# Patient Record
Sex: Male | Born: 1955 | Race: White | Hispanic: No | Marital: Single | Smoking: Never smoker
Health system: Southern US, Community
[De-identification: ages and names within clinical notes are randomized; demographics above are authoritative.]

## PROBLEM LIST (undated history)

## (undated) DIAGNOSIS — E0590 Thyrotoxicosis, unspecified without thyrotoxic crisis or storm: Secondary | ICD-10-CM

## (undated) DIAGNOSIS — D509 Iron deficiency anemia, unspecified: Secondary | ICD-10-CM

## (undated) DIAGNOSIS — H9312 Tinnitus, left ear: Secondary | ICD-10-CM

## (undated) DIAGNOSIS — F419 Anxiety disorder, unspecified: Secondary | ICD-10-CM

## (undated) DIAGNOSIS — I517 Cardiomegaly: Secondary | ICD-10-CM

## (undated) DIAGNOSIS — K219 Gastro-esophageal reflux disease without esophagitis: Secondary | ICD-10-CM

## (undated) DIAGNOSIS — N529 Male erectile dysfunction, unspecified: Secondary | ICD-10-CM

## (undated) HISTORY — DX: Tinnitus, left ear: H93.12

## (undated) HISTORY — DX: Iron deficiency anemia, unspecified: D50.9

## (undated) HISTORY — DX: Gastro-esophageal reflux disease without esophagitis: K21.9

## (undated) HISTORY — DX: Male erectile dysfunction, unspecified: N52.9

## (undated) HISTORY — DX: Thyrotoxicosis, unspecified without thyrotoxic crisis or storm: E05.90

## (undated) HISTORY — DX: Anxiety disorder, unspecified: F41.9

## (undated) HISTORY — DX: Cardiomegaly: I51.7

---

## 2000-12-20 ENCOUNTER — Emergency Department (HOSPITAL_COMMUNITY): Admission: EM | Admit: 2000-12-20 | Discharge: 2000-12-21 | Payer: Self-pay | Admitting: *Deleted

## 2000-12-26 ENCOUNTER — Encounter: Payer: Self-pay | Admitting: Internal Medicine

## 2000-12-26 ENCOUNTER — Encounter: Admission: RE | Admit: 2000-12-26 | Discharge: 2000-12-26 | Payer: Self-pay | Admitting: Internal Medicine

## 2000-12-27 ENCOUNTER — Emergency Department (HOSPITAL_COMMUNITY): Admission: EM | Admit: 2000-12-27 | Discharge: 2000-12-27 | Payer: Self-pay | Admitting: Emergency Medicine

## 2007-09-17 ENCOUNTER — Encounter: Admission: RE | Admit: 2007-09-17 | Discharge: 2007-09-17 | Payer: Self-pay | Admitting: General Surgery

## 2007-09-27 ENCOUNTER — Emergency Department (HOSPITAL_COMMUNITY): Admission: EM | Admit: 2007-09-27 | Discharge: 2007-09-27 | Payer: Self-pay | Admitting: Emergency Medicine

## 2014-10-10 ENCOUNTER — Emergency Department (HOSPITAL_COMMUNITY)
Admission: EM | Admit: 2014-10-10 | Discharge: 2014-10-10 | Disposition: A | Discharge status: Home / Self Care | Payer: 59 | Source: Home / Self Care | Attending: Family Medicine | Admitting: Family Medicine

## 2014-10-10 ENCOUNTER — Encounter (HOSPITAL_COMMUNITY): Payer: Self-pay | Admitting: Family Medicine

## 2014-10-10 DIAGNOSIS — L03011 Cellulitis of right finger: Secondary | ICD-10-CM

## 2014-10-10 MED ORDER — IBUPROFEN 800 MG PO TABS
800.0000 mg | ORAL_TABLET | Freq: Once | ORAL | Status: AC
  Administered 2014-10-10: 800 mg via ORAL

## 2014-10-10 MED ORDER — IBUPROFEN 800 MG PO TABS
ORAL_TABLET | ORAL | Status: AC
  Filled 2014-10-10: qty 1

## 2014-10-10 NOTE — Discharge Instructions (Signed)
He developed a condition called paronychia. This was drained in our clinic. We sent a culture to find out which bacteria was present in the abscess. Please remember to wash the area daily with plenty of warm soapy water and tap water. Apply and biotic ointment and a clean bandage as needed. We will call you if you need further antibiotic treatment and please call us if your symptoms worsen.

## 2014-10-10 NOTE — ED Notes (Signed)
Right index finger is swollen and red

## 2014-10-10 NOTE — ED Notes (Signed)
Right index finger red and swollen

## 2014-10-10 NOTE — ED Provider Notes (Signed)
CSN: [id number]     Arrival date & time 10/10/14  1226 History   None    Chief Complaint  Patient presents with  . Finger Injury   (Consider location/radiation/quality/duration/timing/severity/associated sxs/prior Treatment) HPI  Patient reports picking at his finger week ago. Since that time patient has developed progressive constant painful swelling of the fingertip. No appreciable discharge. This involves the patient's right index finger and primarily at the base of the nail. Epsom salt soaks and hydroperoxide without improvement. Denies fevers, further joint involvement, rash, nausea, vomiting, chest pain, shortness of breath, palpitations.    History reviewed. No pertinent past medical history. History reviewed. No pertinent past surgical history. Family History  Problem Relation Age of Onset  . Hypertension Mother   . Cancer Brother     MM   History  Substance Use Topics  . Smoking status: Not on file  . Smokeless tobacco: Not on file  . Alcohol Use: Yes    Review of Systems Per HPI with all other pertinent systems negative.   Allergies  Review of patient's allergies indicates not on file.  Home Medications   Prior to Admission medications   Not on File   BP 160/95 mmHg  Pulse 78  Temp(Src) 98.2 F (36.8 C) (Oral)  Resp 116  SpO2 99% Physical Exam Physical Exam  Constitutional: oriented to person, place, and time. appears well-developed and well-nourished. No distress.  HENT:  Head: Normocephalic and atraumatic.  Eyes: EOMI. PERRL.   Neck: Normal range of motion.  Cardiovascular: RRR, no m/r/g, 2+ distal pulses,  Pulmonary/Chest: Effort normal and breath sounds normal. No respiratory distress.  Abdominal: Soft. Bowel sounds are normal. NonTTP, no distension.  Musculoskeletal: Normal range of motion. Non ttp, no effusion.  Neurological: alert and oriented to person, place, and time.  Skin: R index finger w/ marked swelling and erythema along the dorsum  near the nail.   Psychiatric: normal mood and affect. behavior is normal. Judgment and thought content normal.   ED Course  INCISION AND DRAINAGE Date/Time: 10/10/2014 2:12 PM Performed by: Konrad DoloresMERRELL, DAVID J Authorized by: Konrad DoloresMERRELL, DAVID J Consent: Verbal consent obtained. Risks and benefits: risks, benefits and alternatives were discussed Consent given by: patient Patient identity confirmed: verbally with patient Type: abscess Location: R index paronychia. Anesthesia: digital block Local anesthetic: lidocaine 2% without epinephrine Anesthetic total: 4 ml Scalpel size: 11 Incision type: single straight Complexity: simple Drainage: purulent Drainage amount: copious Wound treatment: wound left open (Copious irrigation with normal saline) Patient tolerance: Patient tolerated the procedure well with no immediate complications   (including critical care time) Labs Review Labs Reviewed - No data to display  Imaging Review No results found.   MDM   1. Paronychia, right    right index finger paronychia. Drain as above. No need for antibiotics as treatment of choice is drainage and copious cleanout. Embolic ointment and sterile bandage applied. Fall precautions discussed.  Of note patient hypertensive but this is a first time for this patient since patient states that he has white coat syndrome. Follow-up with PCP if remains hypertensive. Ibuprofen 800 mg given by mouth in clinic.    Ozella Rocksavid J Merrell, MD 10/10/14 [phone]

## 2014-10-14 ENCOUNTER — Telehealth (HOSPITAL_COMMUNITY): Payer: Self-pay | Admitting: Family Medicine

## 2014-10-14 LAB — CULTURE, ROUTINE-ABSCESS

## 2014-10-14 NOTE — ED Notes (Signed)
Called patient to review culture report and see how he is doing. Patient reports that he is significantly better with only a small [place] of erythema left and very little tenderness. Patient feels nearly back to baseline.  Shelly Flattenavid Merrell, MD Family Medicine 10/14/2014, 4:36 PM    Ozella Rocksavid J Merrell, MD 10/14/14 [phone]

## 2015-08-02 DIAGNOSIS — Z23 Encounter for immunization: Secondary | ICD-10-CM | POA: Diagnosis not present

## 2015-08-02 DIAGNOSIS — Z0000 Encounter for general adult medical examination without abnormal findings: Secondary | ICD-10-CM | POA: Diagnosis not present

## 2016-01-10 DIAGNOSIS — H524 Presbyopia: Secondary | ICD-10-CM | POA: Diagnosis not present

## 2016-01-10 DIAGNOSIS — H52223 Regular astigmatism, bilateral: Secondary | ICD-10-CM | POA: Diagnosis not present

## 2016-01-10 DIAGNOSIS — H5203 Hypermetropia, bilateral: Secondary | ICD-10-CM | POA: Diagnosis not present

## 2016-03-23 DIAGNOSIS — N529 Male erectile dysfunction, unspecified: Secondary | ICD-10-CM | POA: Diagnosis not present

## 2016-08-21 DIAGNOSIS — Z0000 Encounter for general adult medical examination without abnormal findings: Secondary | ICD-10-CM | POA: Diagnosis not present

## 2016-08-21 DIAGNOSIS — Z125 Encounter for screening for malignant neoplasm of prostate: Secondary | ICD-10-CM | POA: Diagnosis not present

## 2016-08-21 DIAGNOSIS — R030 Elevated blood-pressure reading, without diagnosis of hypertension: Secondary | ICD-10-CM | POA: Diagnosis not present

## 2017-02-05 DIAGNOSIS — H52223 Regular astigmatism, bilateral: Secondary | ICD-10-CM | POA: Diagnosis not present

## 2017-02-05 DIAGNOSIS — H524 Presbyopia: Secondary | ICD-10-CM | POA: Diagnosis not present

## 2017-02-05 DIAGNOSIS — H5203 Hypermetropia, bilateral: Secondary | ICD-10-CM | POA: Diagnosis not present

## 2017-06-07 ENCOUNTER — Encounter (HOSPITAL_COMMUNITY): Payer: Self-pay | Admitting: Emergency Medicine

## 2017-06-07 ENCOUNTER — Other Ambulatory Visit: Payer: Self-pay

## 2017-06-07 ENCOUNTER — Ambulatory Visit (HOSPITAL_COMMUNITY)
Admission: EM | Admit: 2017-06-07 | Discharge: 2017-06-07 | Disposition: A | Discharge status: Home / Self Care | Payer: 59 | Attending: Physician Assistant | Admitting: Physician Assistant

## 2017-06-07 DIAGNOSIS — L03011 Cellulitis of right finger: Secondary | ICD-10-CM | POA: Diagnosis not present

## 2017-06-07 MED ORDER — SULFAMETHOXAZOLE-TRIMETHOPRIM 800-160 MG PO TABS: 1.0000 | ORAL_TABLET | Freq: Two times a day (BID) | ORAL | 0 refills | Status: AC

## 2017-06-07 NOTE — ED Provider Notes (Signed)
[facility]    CSN: [id number] Arrival date & time: 06/07/17  1901     History   Chief Complaint Chief Complaint  Patient presents with  . Hand Pain    HPI Maurice Klein is a 62 y.o. male.   The history is provided by the patient. No language interpreter was used.  Hand Pain  This is a new problem. The problem occurs constantly. The problem has been gradually worsening. Nothing aggravates the symptoms. Nothing relieves the symptoms. He has tried nothing for the symptoms. The treatment provided no relief.   Pt reports he gets frequent fingernail infections.   History reviewed. No pertinent past medical history.  There are no active problems to display for this patient.   History reviewed. No pertinent surgical history.     Home Medications    Prior to Admission medications   Medication Sig Start Date End Date Taking? Authorizing Provider  sulfamethoxazole-trimethoprim (BACTRIM DS,SEPTRA DS) 800-160 MG tablet Take 1 tablet by mouth 2 (two) times daily for 7 days. 06/07/17 06/14/17  Elson AreasSofia, Leslie K, PA-C    Family History Family History  Problem Relation Age of Onset  . Hypertension Mother   . Cancer Brother        MM    Social History Social History   Tobacco Use  . Smoking status: Never Smoker  . Smokeless tobacco: Never Used  Substance Use Topics  . Alcohol use: Yes  . Drug use: No     Allergies   Patient has no known allergies.   Review of Systems Review of Systems  All other systems reviewed and are negative.    Physical Exam Triage Vital Signs ED Triage Vitals  Enc Vitals Group     BP 06/07/17 1953 (!) 169/93     Pulse Rate 06/07/17 1953 80     Resp 06/07/17 1953 18     Temp 06/07/17 1953 98.2 F (36.8 C)     Temp Source 06/07/17 1953 Oral     SpO2 06/07/17 1953 99 %     Weight --      Height --      Head Circumference --      Peak Flow --      Pain Score 06/07/17 1952 3     Pain Loc --      Pain Edu? --      Excl.  in GC? --    No data found.  Updated Vital Signs BP (!) 169/93 (BP Location: Left Arm)   Pulse 80   Temp 98.2 F (36.8 C) (Oral)   Resp 18   SpO2 99%   Visual Acuity Right Eye Distance:   Left Eye Distance:   Bilateral Distance:    Right Eye Near:   Left Eye Near:    Bilateral Near:     Physical Exam  Constitutional: He appears well-developed and well-nourished.  Musculoskeletal: He exhibits tenderness.  Swelling distal right 3rd finger nv and ns intact  Neurological: He is alert.  Skin: Skin is warm.  Psychiatric: He has a normal mood and affect.  Vitals reviewed.    UC Treatments / Results  Labs (all labs ordered are listed, but only abnormal results are displayed) Labs Reviewed - No data to display  EKG  EKG Interpretation None       Radiology No results found.  Procedures Procedures (including critical care time)  Medications Ordered in UC Medications - No data to display   Initial Impression /  Assessment and Plan / UC Course  I have reviewed the triage vital signs and the nursing notes.  Pertinent labs & imaging results that were available during my care of the patient were reviewed by me and considered in my medical decision making (see chart for details).     Procedure.  Prep with shurcleans,  Freeze spray,  Incised with 18 gauge needle.  No drainage.    Final Clinical Impressions(s) / UC Diagnoses   Final diagnoses:  Paronychia of right middle finger    ED Discharge Orders        Ordered    sulfamethoxazole-trimethoprim (BACTRIM DS,SEPTRA DS) 800-160 MG tablet  2 times daily     06/07/17 2020       Controlled Substance Prescriptions [place] Controlled Substance Registry consulted? Not Applicable   Pt advised soak area 20 minutes 4 times a day  An After Visit Summary was printed and given to the patient. Meds ordered this encounter  Medications  . sulfamethoxazole-trimethoprim (BACTRIM DS,SEPTRA DS) 800-160 MG tablet    Sig: Take  1 tablet by mouth 2 (two) times daily for 7 days.    Dispense:  14 tablet    Refill:  0    Order Specific Question:   Supervising Provider    Answer:   Isa Rankin [[id number]]     Elson Areas, [place] 06/07/17 2032

## 2017-06-07 NOTE — ED Triage Notes (Signed)
[facility] a complaint of a possible paronychia to the middle finger of the right hand x 3 days. The patient presented with pain and redness and swelling.

## 2017-06-07 NOTE — Discharge Instructions (Signed)
Soak area 20 minutes 4 times a day °

## 2017-06-19 DIAGNOSIS — E871 Hypo-osmolality and hyponatremia: Secondary | ICD-10-CM | POA: Diagnosis not present

## 2017-06-19 DIAGNOSIS — R946 Abnormal results of thyroid function studies: Secondary | ICD-10-CM | POA: Insufficient documentation

## 2017-06-19 DIAGNOSIS — R419 Unspecified symptoms and signs involving cognitive functions and awareness: Secondary | ICD-10-CM | POA: Diagnosis present

## 2017-06-19 DIAGNOSIS — F419 Anxiety disorder, unspecified: Secondary | ICD-10-CM | POA: Diagnosis not present

## 2017-06-20 ENCOUNTER — Encounter (HOSPITAL_COMMUNITY): Payer: Self-pay

## 2017-06-20 ENCOUNTER — Emergency Department (HOSPITAL_COMMUNITY): Payer: 59

## 2017-06-20 ENCOUNTER — Other Ambulatory Visit: Payer: Self-pay

## 2017-06-20 ENCOUNTER — Emergency Department (HOSPITAL_COMMUNITY)
Admission: EM | Admit: 2017-06-20 | Discharge: 2017-06-20 | Disposition: A | Discharge status: Home / Self Care | Payer: 59 | Attending: Emergency Medicine | Admitting: Emergency Medicine

## 2017-06-20 DIAGNOSIS — R7989 Other specified abnormal findings of blood chemistry: Secondary | ICD-10-CM

## 2017-06-20 DIAGNOSIS — E871 Hypo-osmolality and hyponatremia: Secondary | ICD-10-CM | POA: Diagnosis not present

## 2017-06-20 DIAGNOSIS — R946 Abnormal results of thyroid function studies: Secondary | ICD-10-CM | POA: Diagnosis not present

## 2017-06-20 DIAGNOSIS — G9389 Other specified disorders of brain: Secondary | ICD-10-CM | POA: Diagnosis not present

## 2017-06-20 DIAGNOSIS — F410 Panic disorder [episodic paroxysmal anxiety] without agoraphobia: Secondary | ICD-10-CM

## 2017-06-20 DIAGNOSIS — F419 Anxiety disorder, unspecified: Secondary | ICD-10-CM | POA: Diagnosis not present

## 2017-06-20 LAB — BASIC METABOLIC PANEL
Anion gap: 10 (ref 5–15)
BUN: 16 mg/dL (ref 6–20)
CHLORIDE: 98 mmol/L — AB (ref 101–111)
CO2: 23 mmol/L (ref 22–32)
CREATININE: 0.57 mg/dL — AB (ref 0.61–1.24)
Calcium: 9 mg/dL (ref 8.9–10.3)
GFR calc non Af Amer: >60 mL/min (ref >60)
Glucose, Bld: 103 mg/dL — ABNORMAL HIGH (ref 65–99)
POTASSIUM: 3.6 mmol/L (ref 3.5–5.1)
SODIUM: 131 mmol/L — AB (ref 135–145)

## 2017-06-20 LAB — I-STAT TROPONIN, ED
TROPONIN I, POC: 0.02 ng/mL (ref 0.00–0.08)
TROPONIN I, POC: 0.01 ng/mL (ref 0.00–0.08)

## 2017-06-20 LAB — URINALYSIS, ROUTINE W REFLEX MICROSCOPIC
Bilirubin Urine: NEGATIVE
Glucose, UA: NEGATIVE mg/dL
Hgb urine dipstick: NEGATIVE
KETONES UR: 5 mg/dL — AB
LEUKOCYTES UA: NEGATIVE
NITRITE: NEGATIVE
PROTEIN: NEGATIVE mg/dL
Specific Gravity, Urine: 1.014 (ref 1.005–1.030)
pH: 7 (ref 5.0–8.0)

## 2017-06-20 LAB — TSH: TSH: 6.207 u[IU]/mL — AB (ref 0.350–4.500)

## 2017-06-20 LAB — CBC WITH DIFFERENTIAL/PLATELET
BASOS PCT: 0 %
Basophils Absolute: 0 10*3/uL (ref 0.0–0.1)
Eosinophils Absolute: 0.1 10*3/uL (ref 0.0–0.7)
Eosinophils Relative: 1 %
HEMATOCRIT: 35.8 % — AB (ref 39.0–52.0)
HEMOGLOBIN: 11.8 g/dL — AB (ref 13.0–17.0)
LYMPHS ABS: 1.5 10*3/uL (ref 0.7–4.0)
Lymphocytes Relative: 18 %
MCH: 24.5 pg — ABNORMAL LOW (ref 26.0–34.0)
MCHC: 33 g/dL (ref 30.0–36.0)
MCV: 74.3 fL — ABNORMAL LOW (ref 78.0–100.0)
MONOS PCT: 7 %
Monocytes Absolute: 0.6 10*3/uL (ref 0.1–1.0)
NEUTROS ABS: 6.1 10*3/uL (ref 1.7–7.7)
Neutrophils Relative %: 74 %
Platelets: 282 10*3/uL (ref 150–400)
RBC: 4.82 MIL/uL (ref 4.22–5.81)
RDW: 17.3 % — ABNORMAL HIGH (ref 11.5–15.5)
WBC: 8.3 10*3/uL (ref 4.0–10.5)

## 2017-06-20 LAB — CBG MONITORING, ED: Glucose-Capillary: 113 mg/dL — ABNORMAL HIGH (ref 65–99)

## 2017-06-20 LAB — TROPONIN I

## 2017-06-20 MED ORDER — SODIUM CHLORIDE 0.9 % IV BOLUS (SEPSIS)
1000.0000 mL | Freq: Once | INTRAVENOUS | Status: AC
  Administered 2017-06-20: 1000 mL via INTRAVENOUS

## 2017-06-20 NOTE — ED Triage Notes (Signed)
Pt reports episodes of anxiety over the past week where he feels nauseous, weak in his legs, and anxious for short periods of time. He reports that today the anxiety has been more constant. Denies SI/HI. A&Ox4.

## 2017-06-20 NOTE — ED Notes (Signed)
Patient transported to X-ray 

## 2017-06-20 NOTE — ED Provider Notes (Signed)
[facility]-EMERGENCY DEPT Provider Note   CSN: [id number] Arrival date & time: 06/19/17  2240     History   Chief Complaint Chief Complaint  Patient presents with  . Anxiety    HPI Maurice Klein is a 62 y.o. male.  The history is provided by the patient.  Anxiety  This is a recurrent problem. The current episode started more than 1 week ago. Episode frequency: episodically  The problem has not changed since onset.Pertinent negatives include no chest pain, no abdominal pain, no headaches and no shortness of breath. Associated symptoms comments: Weak all over and shaky . Nothing aggravates the symptoms. Nothing relieves the symptoms. He has tried nothing for the symptoms. The treatment provided no relief.  Is feeling anxious, episodes lasting secondas to minutes with shakiness and global weakness. No chest pain or shortness of breath.  States he has been seen by Dr. Valentina Lucks in the past for same and has been on Ativan in the past for same.  Marland Kitchen     History reviewed. No pertinent past medical history.  There are no active problems to display for this patient.   History reviewed. No pertinent surgical history.     Home Medications    Prior to Admission medications   Not on File    Family History Family History  Problem Relation Age of Onset  . Hypertension Mother   . Cancer Brother        MM    Social History Social History   Tobacco Use  . Smoking status: Never Smoker  . Smokeless tobacco: Never Used  Substance Use Topics  . Alcohol use: Yes  . Drug use: No     Allergies   Patient has no known allergies.   Review of Systems Review of Systems  Constitutional:       Feels globally weak and shaky  Eyes: Negative for photophobia.  Respiratory: Negative for shortness of breath.   Cardiovascular: Negative for chest pain.  Gastrointestinal: Negative for abdominal pain.  Neurological: Negative for syncope, facial asymmetry, speech  difficulty, weakness, light-headedness, numbness and headaches.  Psychiatric/Behavioral: The patient is nervous/anxious.   All other systems reviewed and are negative.    Physical Exam Updated Vital Signs BP (!) 172/83 (BP Location: Left Arm)   Pulse 95   Temp 98.4 F (36.9 C)   Resp 16   SpO2 95%   Physical Exam  Constitutional: He is oriented to person, place, and time. He appears well-developed and well-nourished. No distress.  HENT:  Head: Normocephalic and atraumatic.  Mouth/Throat: No oropharyngeal exudate.  Eyes: Conjunctivae are normal. Pupils are equal, round, and reactive to light.  Neck: Normal range of motion. Neck supple.  Cardiovascular: Normal rate, regular rhythm, normal heart sounds and intact distal pulses.  Pulmonary/Chest: Effort normal and breath sounds normal. No stridor. He has no wheezes. He has no rales.  Abdominal: Soft. Bowel sounds are normal. He exhibits no mass. There is no tenderness. There is no rebound and no guarding.  Musculoskeletal: Normal range of motion.  Neurological: He is alert and oriented to person, place, and time. He displays normal reflexes.  Skin: Skin is warm and dry. Capillary refill takes less than 2 seconds.  Psychiatric: He has a normal mood and affect. His behavior is normal.     ED Treatments / Results  Labs (all labs ordered are listed, but only abnormal results are displayed)  Results for orders placed or performed during the hospital encounter  of 06/20/17  CBC with Differential/Platelet  Result Value Ref Range   WBC 8.3 4.0 - 10.5 K/uL   RBC 4.82 4.22 - 5.81 MIL/uL   Hemoglobin 11.8 (L) 13.0 - 17.0 g/dL   HCT 16.135.8 (L) 09.639.0 - 04.552.0 %   MCV 74.3 (L) 78.0 - 100.0 fL   MCH 24.5 (L) 26.0 - 34.0 pg   MCHC 33.0 30.0 - 36.0 g/dL   RDW 40.917.3 (H) 81.111.5 - 91.415.5 %   Platelets 282 150 - 400 K/uL   Neutrophils Relative % 74 %   Neutro Abs 6.1 1.7 - 7.7 K/uL   Lymphocytes Relative 18 %   Lymphs Abs 1.5 0.7 - 4.0 K/uL    Monocytes Relative 7 %   Monocytes Absolute 0.6 0.1 - 1.0 K/uL   Eosinophils Relative 1 %   Eosinophils Absolute 0.1 0.0 - 0.7 K/uL   Basophils Relative 0 %   Basophils Absolute 0.0 0.0 - 0.1 K/uL  Basic metabolic panel  Result Value Ref Range   Sodium 131 (L) 135 - 145 mmol/L   Potassium 3.6 3.5 - 5.1 mmol/L   Chloride 98 (L) 101 - 111 mmol/L   CO2 23 22 - 32 mmol/L   Glucose, Bld 103 (H) 65 - 99 mg/dL   BUN 16 6 - 20 mg/dL   Creatinine, Ser 7.820.57 (L) 0.61 - 1.24 mg/dL   Calcium 9.0 8.9 - 95.610.3 mg/dL   GFR calc non Af Amer >60 >60 mL/min   GFR calc Af Amer >60 >60 mL/min   Anion gap 10 5 - 15  POC CBG, ED  Result Value Ref Range   Glucose-Capillary 113 (H) 65 - 99 mg/dL  I-stat troponin, ED  Result Value Ref Range   Troponin i, poc 0.02 0.00 - 0.08 ng/mL   Comment 3           Dg Chest 2 View  Result Date: 06/20/2017 CLINICAL DATA:  Anxiety.  Weak and shaky. EXAM: CHEST  2 VIEW COMPARISON:  09/17/2007 FINDINGS: Normal heart size and pulmonary vascularity. No focal airspace disease or consolidation in the lungs. No blunting of costophrenic angles. No pneumothorax. Mediastinal contours appear intact. IMPRESSION: No active cardiopulmonary disease. Electronically Signed   By: Burman NievesWilliam  Stevens M.D.   On: 06/20/2017 03:19   Ct Head Wo Contrast  Result Date: 06/20/2017 CLINICAL DATA:  Anxiety for 1 week EXAM: CT HEAD WITHOUT CONTRAST TECHNIQUE: Contiguous axial images were obtained from the base of the skull through the vertex without intravenous contrast. COMPARISON:  None. FINDINGS: Brain: No evidence of acute infarction, hemorrhage, hydrocephalus, extra-axial collection or mass lesion/mass effect. Vascular: No hyperdense vessels. Minimal calcification at the carotid siphons Skull: Normal. Negative for fracture or focal lesion. Sinuses/Orbits: Mild mucosal thickening in the ethmoid sinuses. No acute orbital abnormality. Other: None IMPRESSION: Negative.  No CT evidence for acute  intracranial abnormality. Electronically Signed   By: Jasmine PangKim  Fujinaga M.D.   On: 06/20/2017 03:22    EKG  EKG Interpretation  Date/Time:  Thursday June 20 2017 03:27:37 EST Ventricular Rate:  62 PR Interval:    QRS Duration: 115 QT Interval:  433 QTC Calculation: 440 R Axis:   17 Text Interpretation:  Sinus rhythm Incomplete right bundle branch block Left ventricular hypertrophy Confirmed by Toluwanimi Radebaugh ([id number]) on 06/20/2017 3:41:28 AM       Radiology Dg Chest 2 View  Result Date: 06/20/2017 CLINICAL DATA:  Anxiety.  Weak and shaky. EXAM: CHEST  2 VIEW COMPARISON:  09/17/2007  FINDINGS: Normal heart size and pulmonary vascularity. No focal airspace disease or consolidation in the lungs. No blunting of costophrenic angles. No pneumothorax. Mediastinal contours appear intact. IMPRESSION: No active cardiopulmonary disease. Electronically Signed   By: Burman Nieves M.D.   On: 06/20/2017 03:19    Procedures Procedures (including critical care time)  EDP had lengthy discussion with patient regarding his lab and imaging results.  EDP stated patient had LVH on EKG.  Patient states he has had this in the past.  Patient requested a TSH and this was ordered.  EDP explained it may not come back during this visit but it was ordered and Dr. Valentina Lucks can follow up on the results. Given the hyponatremia (he states a while ago it was 134) we would give a liter of NSS and EDP stated we would recheck a troponin to ensure that this was not cardiac in nature.   Patient states he would like ativan, but drove himself and has a long drive home.  EDP states she would be happy to give the patient a dose but patient would need a ride home as we cannot put patient in a cab or Uber.    [place] database checked no RX for ativan in the system.    Patient has refused TTS consult  I am uncomfortable starting ativan in this patient. As I am unsure what is bringing on these feelings, I will refer patient to his PMD  for further treatment.  Moreover, will refer to endocrinology for further testing of his thyroid.    Final Clinical Impressions(s) / ED Diagnoses    Return for weakness, numbness, changes in vision or speech,  fevers > 100.4 unrelieved by medication, shortness of breath, intractable vomiting, or diarrhea, abdominal pain, Inability to tolerate liquids or food, cough, altered mental status or any concerns. No signs of systemic illness or infection. The patient is nontoxic-appearing on exam and vital signs are within normal limits.    I have reviewed the triage vital signs and the nursing notes. Pertinent labs &imaging results that were available during my care of the patient were reviewed by me and considered in my medical decision making (see chart for details).  After history, exam, and medical workup I feel the patient has been appropriately medically screened and is safe for discharge home. Pertinent diagnoses were discussed with the patient. Patient was given return precautions.      Azari Janssens, MD 06/20/17 [phone]

## 2017-06-21 DIAGNOSIS — Z1211 Encounter for screening for malignant neoplasm of colon: Secondary | ICD-10-CM | POA: Diagnosis not present

## 2017-06-21 DIAGNOSIS — E871 Hypo-osmolality and hyponatremia: Secondary | ICD-10-CM | POA: Diagnosis not present

## 2017-06-21 DIAGNOSIS — R946 Abnormal results of thyroid function studies: Secondary | ICD-10-CM | POA: Diagnosis not present

## 2017-06-21 DIAGNOSIS — F419 Anxiety disorder, unspecified: Secondary | ICD-10-CM | POA: Diagnosis not present

## 2017-06-21 DIAGNOSIS — D649 Anemia, unspecified: Secondary | ICD-10-CM | POA: Diagnosis not present

## 2017-06-21 DIAGNOSIS — D509 Iron deficiency anemia, unspecified: Secondary | ICD-10-CM | POA: Diagnosis not present

## 2017-07-23 DIAGNOSIS — Z1211 Encounter for screening for malignant neoplasm of colon: Secondary | ICD-10-CM | POA: Diagnosis not present

## 2017-07-23 DIAGNOSIS — D509 Iron deficiency anemia, unspecified: Secondary | ICD-10-CM | POA: Diagnosis not present

## 2017-07-29 ENCOUNTER — Encounter: Payer: Self-pay | Admitting: Psychiatry

## 2017-07-29 ENCOUNTER — Ambulatory Visit: Payer: Self-pay | Admitting: Psychiatry

## 2017-07-29 VITALS — Temp 98.2°F | Resp 20 | Wt 185.4 lb

## 2017-07-29 DIAGNOSIS — J3489 Other specified disorders of nose and nasal sinuses: Secondary | ICD-10-CM

## 2017-07-29 DIAGNOSIS — R0989 Other specified symptoms and signs involving the circulatory and respiratory systems: Secondary | ICD-10-CM

## 2017-07-29 LAB — POCT INFLUENZA A/B
INFLUENZA A, POC: NEGATIVE
Influenza B, POC: NEGATIVE

## 2017-07-29 NOTE — Progress Notes (Signed)
Subjective:     Maurice Klein is a 62 y.o. male who presents for evaluation and treatment of allergic symptoms. Symptoms include: clear rhinorrhea and are not present in a seasonal pattern. Precipitants include: unknown. Treatment currently includes none . 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 He reports he was having some mild nasal drainage and was sniffing, his coworkers thought he might have gotten the flu and asked for him to go get checked. Patient denies any associated symptoms of fever, sore throat, congestion, body ache, shortness of breath or chest pain.  The following portions of the patient's history were reviewed and updated as appropriate: allergies, current medications, past family history, past medical history, past social history, past surgical history and problem list.  Review of Systems Pertinent items are noted in HPI.    Objective:    General appearance: alert, cooperative and no distress Head: Normocephalic, without obvious abnormality, atraumatic Ears: normal TM's and external ear canals both ears Nose: Nares normal. Septum midline. Mucosa normal. No drainage or sinus tenderness., turbinates pale Throat: lips, mucosa, and tongue normal; teeth and gums normal Neck: no adenopathy, no carotid bruit, no JVD, supple, symmetrical, trachea midline and thyroid not enlarged, symmetric, no tenderness/mass/nodules Lungs: clear to auscultation bilaterally Heart: regular rate and rhythm, S1, S2 normal, no murmur, click, rub or gallop    Results for orders placed or performed in visit on 07/29/17 (from the past 24 hour(s))  POCT Influenza A/B     Status: Normal   Collection Time: 07/29/17  2:45 PM  Result Value Ref Range   Influenza A, POC Negative Negative   Influenza B, POC Negative Negative    Assessment:    Allergic rhinitis.   Howie was seen today for congestion.  Diagnoses and all orders for this visit:  Nasal drainage -     POCT  Influenza A/B    Plan:   Follow up as needed. Return to the clinic if symptom persist beyond 7 days or go to the ED with any worsening symptoms. Continue routine exercise and ensure adequate fluid intake.

## 2017-08-02 ENCOUNTER — Telehealth: Payer: Self-pay

## 2017-08-02 NOTE — Telephone Encounter (Signed)
Called and spoke with pt to see how he was feeling since his visit with us and pt states he is doing much better.

## 2017-08-06 DIAGNOSIS — D509 Iron deficiency anemia, unspecified: Secondary | ICD-10-CM | POA: Diagnosis not present

## 2017-08-22 DIAGNOSIS — R030 Elevated blood-pressure reading, without diagnosis of hypertension: Secondary | ICD-10-CM | POA: Diagnosis not present

## 2017-08-22 DIAGNOSIS — Z0000 Encounter for general adult medical examination without abnormal findings: Secondary | ICD-10-CM | POA: Diagnosis not present

## 2017-08-22 DIAGNOSIS — K219 Gastro-esophageal reflux disease without esophagitis: Secondary | ICD-10-CM | POA: Diagnosis not present

## 2017-08-22 DIAGNOSIS — Z23 Encounter for immunization: Secondary | ICD-10-CM | POA: Diagnosis not present

## 2017-09-18 DIAGNOSIS — Z1211 Encounter for screening for malignant neoplasm of colon: Secondary | ICD-10-CM | POA: Diagnosis not present

## 2017-10-22 DIAGNOSIS — R030 Elevated blood-pressure reading, without diagnosis of hypertension: Secondary | ICD-10-CM | POA: Diagnosis not present

## 2018-03-28 DIAGNOSIS — H524 Presbyopia: Secondary | ICD-10-CM | POA: Diagnosis not present

## 2018-03-28 DIAGNOSIS — Z135 Encounter for screening for eye and ear disorders: Secondary | ICD-10-CM | POA: Diagnosis not present

## 2018-07-07 DIAGNOSIS — F419 Anxiety disorder, unspecified: Secondary | ICD-10-CM | POA: Diagnosis not present

## 2018-07-07 DIAGNOSIS — M62838 Other muscle spasm: Secondary | ICD-10-CM | POA: Diagnosis not present

## 2018-07-07 DIAGNOSIS — R030 Elevated blood-pressure reading, without diagnosis of hypertension: Secondary | ICD-10-CM | POA: Diagnosis not present

## 2018-07-15 MED FILL — MUPIROCIN 2% OINTMENT: 2 | 30 days supply | Qty: 22 | Fill #0

## 2018-08-21 DIAGNOSIS — M542 Cervicalgia: Secondary | ICD-10-CM | POA: Diagnosis not present

## 2018-08-21 DIAGNOSIS — K219 Gastro-esophageal reflux disease without esophagitis: Secondary | ICD-10-CM | POA: Diagnosis not present

## 2018-08-21 DIAGNOSIS — F419 Anxiety disorder, unspecified: Secondary | ICD-10-CM | POA: Diagnosis not present

## 2018-08-21 DIAGNOSIS — R030 Elevated blood-pressure reading, without diagnosis of hypertension: Secondary | ICD-10-CM | POA: Diagnosis not present

## 2019-05-19 DIAGNOSIS — Z8639 Personal history of other endocrine, nutritional and metabolic disease: Secondary | ICD-10-CM | POA: Diagnosis not present

## 2019-05-19 DIAGNOSIS — Z1159 Encounter for screening for other viral diseases: Secondary | ICD-10-CM | POA: Diagnosis not present

## 2019-05-19 DIAGNOSIS — Z125 Encounter for screening for malignant neoplasm of prostate: Secondary | ICD-10-CM | POA: Diagnosis not present

## 2019-05-19 DIAGNOSIS — E039 Hypothyroidism, unspecified: Secondary | ICD-10-CM | POA: Diagnosis not present

## 2019-05-19 DIAGNOSIS — Z0000 Encounter for general adult medical examination without abnormal findings: Secondary | ICD-10-CM | POA: Diagnosis not present

## 2019-06-19 IMAGING — CT CT HEAD W/O CM
3 series · 16 of 47 positions shown, 19 images · non-contrast
Comparison: None.

CLINICAL DATA: Anxiety for 1 week

EXAM:
CT HEAD WITHOUT CONTRAST
TECHNIQUE: Contiguous axial images were obtained from the base of the skull
through the vertex without intravenous contrast.

[Series 2: head wo · axial · 0.47mm/px · z∈[+1451,+1581]mm · 10 of 32 slices shown, 13 images]
[im 3/32  brain]
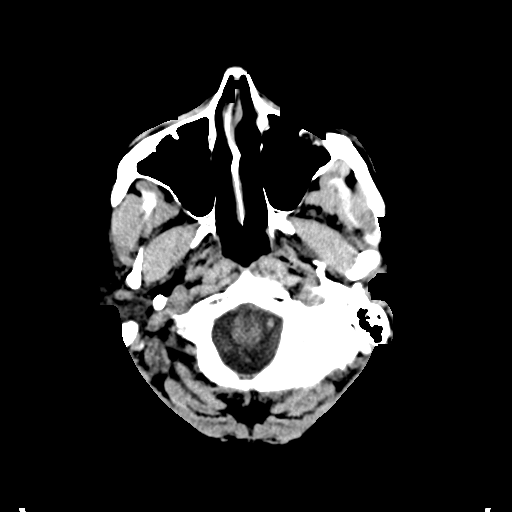
[im 3/32  bone]
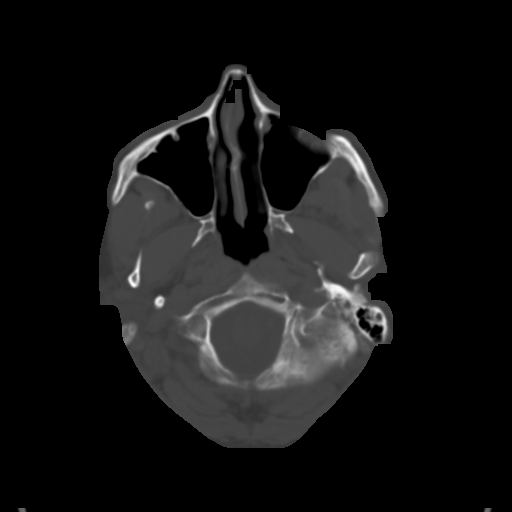
[im 6/32  brain]
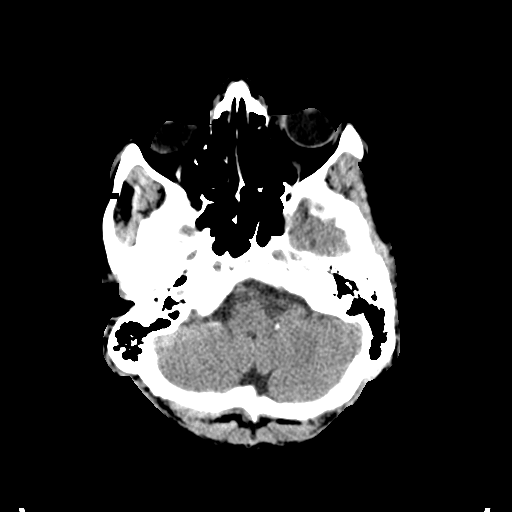
[im 9/32  brain]
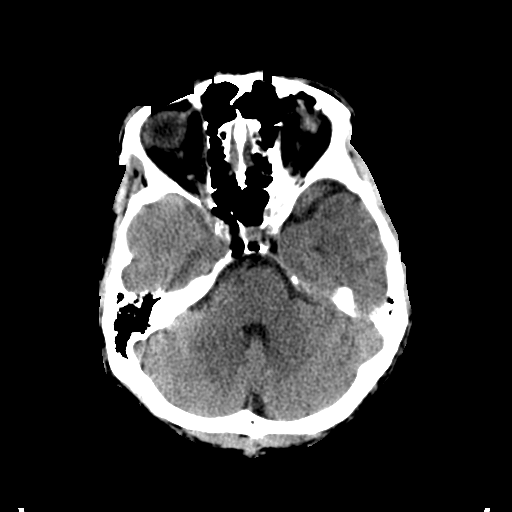
[im 11/32  brain]
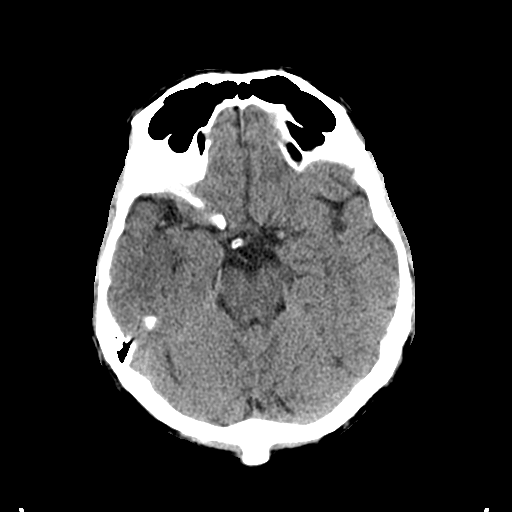
[im 14/32  brain]
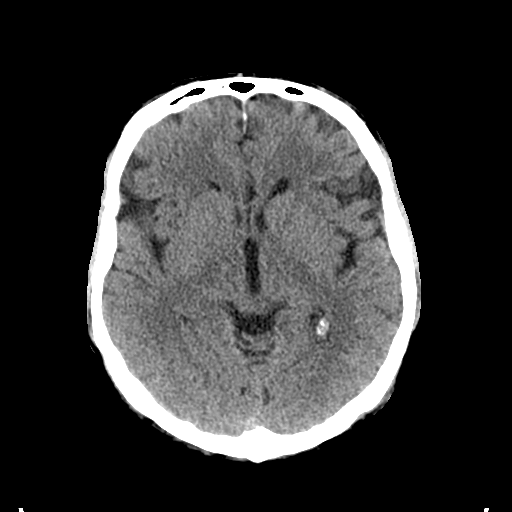
[im 14/32  bone]
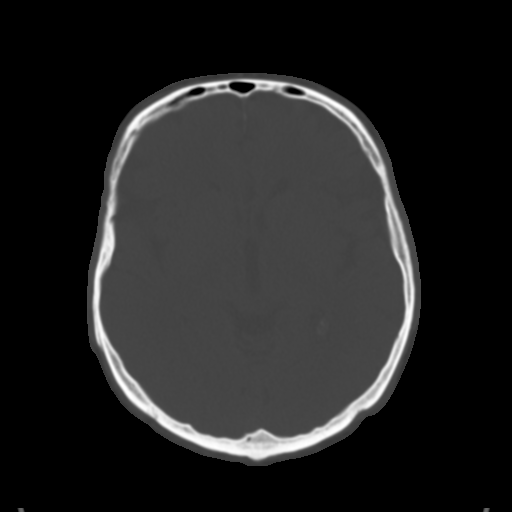
[im 18/32  brain]
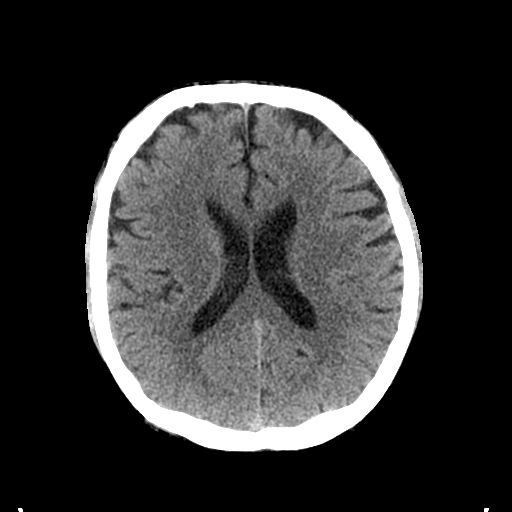
[im 21/32  brain]
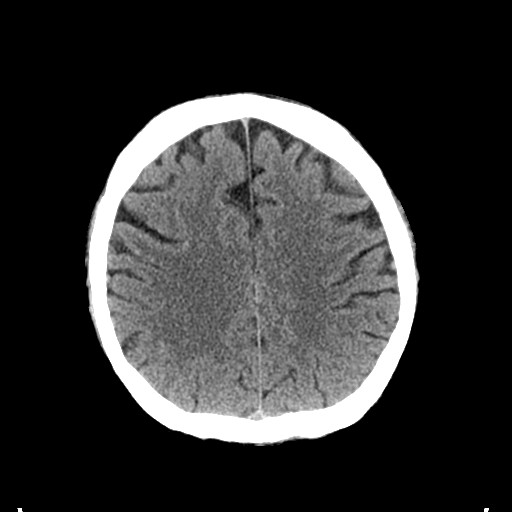
[im 24/32  brain]
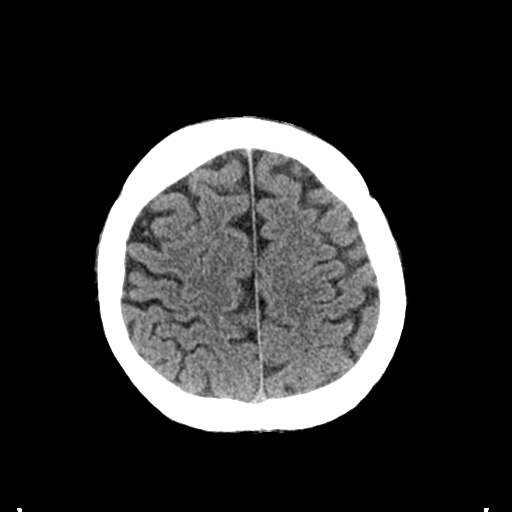
[im 26/32  brain]
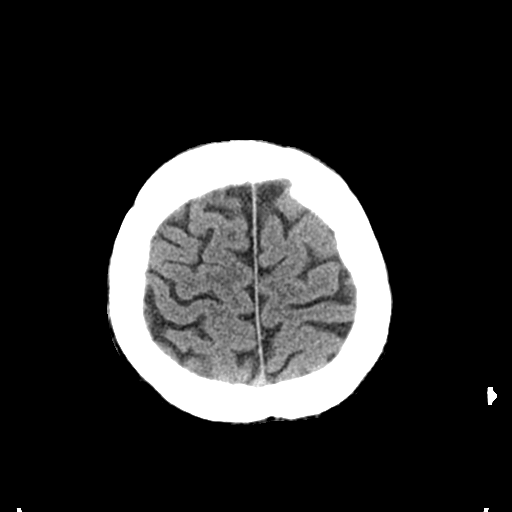
[im 26/32  bone]
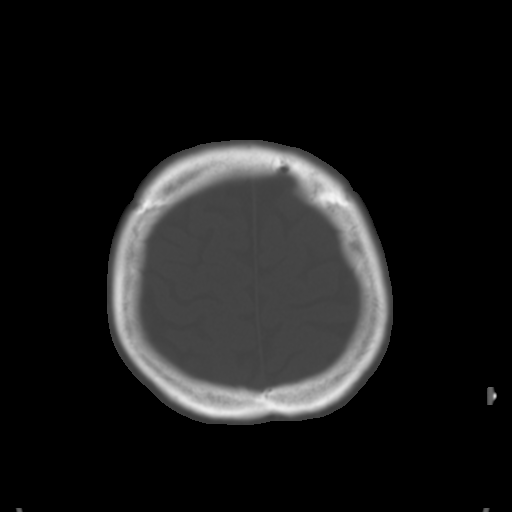
[im 29/32  brain]
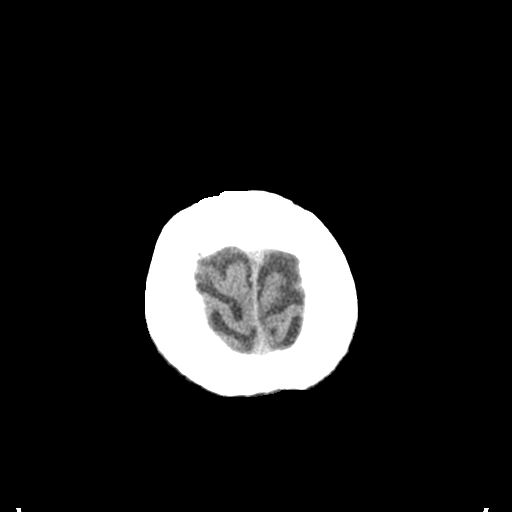

[Series 4: coronal soft tissue · coronal · 0.31mm/px · 3 of 63 slices shown]
[im 21/63  brain]
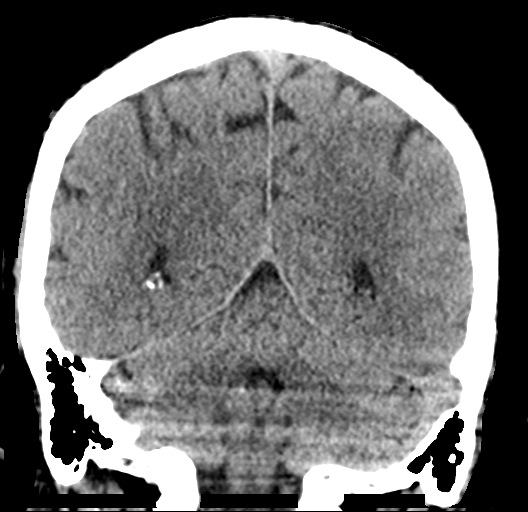
[im 28/63  brain]
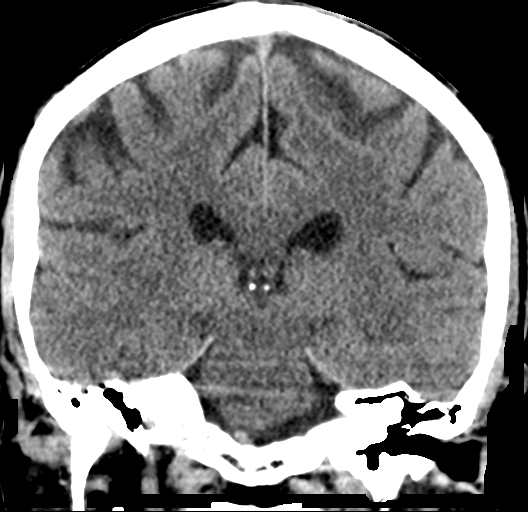
[im 35/63  brain]
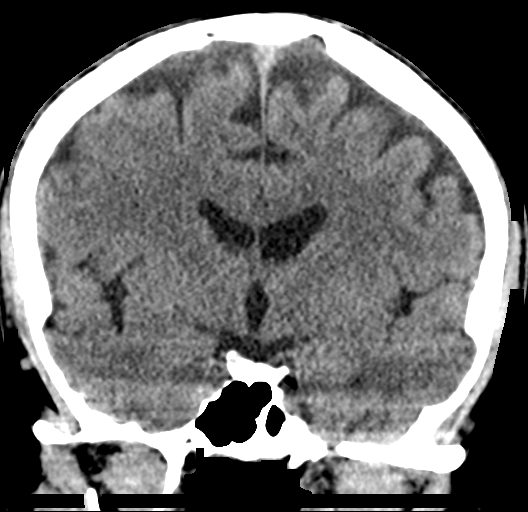

[Series 5: sagittal soft tissue · sagittal · 0.31mm/px · 3 of 54 slices shown]
[im 18/54  brain]
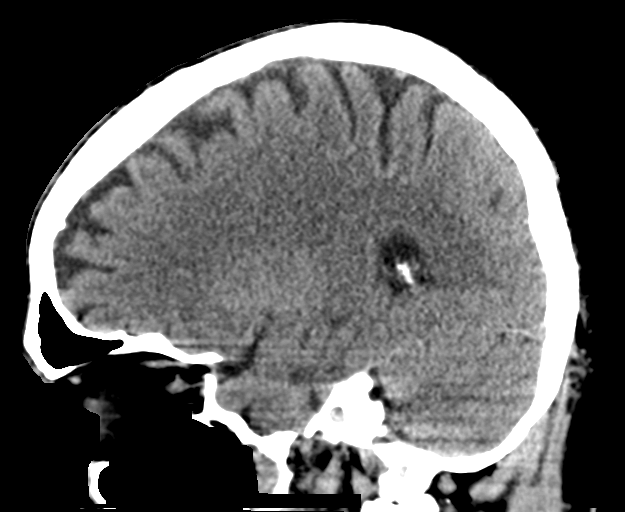
[im 27/54  brain]
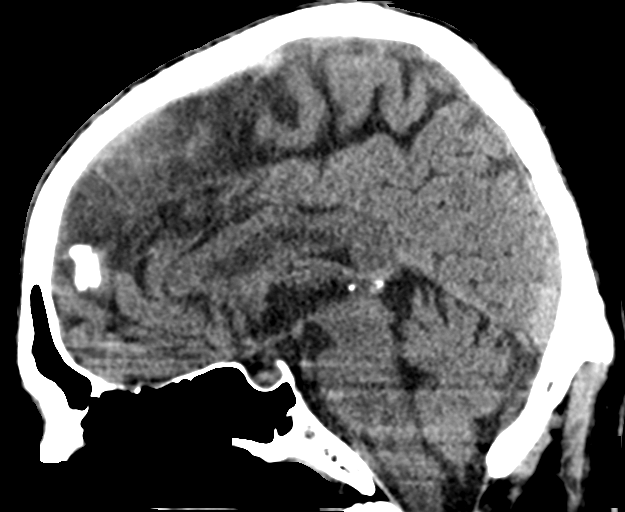
[im 36/54  brain]
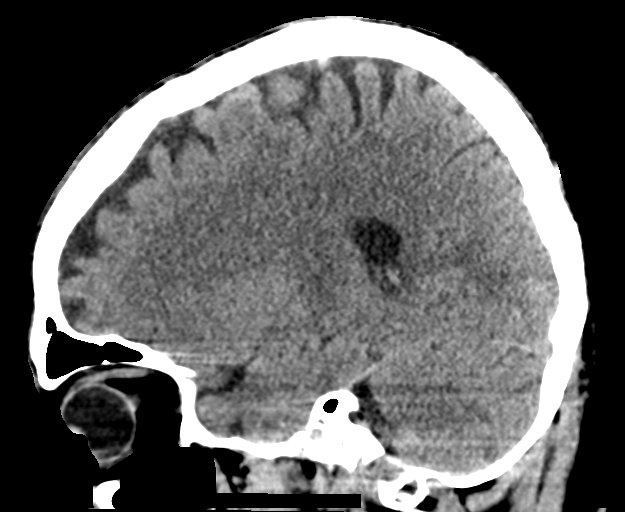

[16 of 47 positions shown; findings below may reference images not displayed]

FINDINGS: Brain: No evidence of acute infarction, hemorrhage, hydrocephalus,
extra-axial collection or mass lesion/mass effect.

Vascular: No hyperdense vessels. Minimal calcification at the
carotid siphons

Skull: Normal. Negative for fracture or focal lesion.

Sinuses/Orbits: Mild mucosal thickening in the ethmoid sinuses. No
acute orbital abnormality.

Other: None
IMPRESSION: Negative.  No CT evidence for acute intracranial abnormality.

## 2019-07-17 DIAGNOSIS — H524 Presbyopia: Secondary | ICD-10-CM | POA: Diagnosis not present

## 2019-07-17 DIAGNOSIS — Z135 Encounter for screening for eye and ear disorders: Secondary | ICD-10-CM | POA: Diagnosis not present

## 2020-01-11 DIAGNOSIS — H6123 Impacted cerumen, bilateral: Secondary | ICD-10-CM | POA: Diagnosis not present

## 2020-01-11 DIAGNOSIS — H8110 Benign paroxysmal vertigo, unspecified ear: Secondary | ICD-10-CM | POA: Diagnosis not present

## 2020-01-11 DIAGNOSIS — R030 Elevated blood-pressure reading, without diagnosis of hypertension: Secondary | ICD-10-CM | POA: Diagnosis not present

## 2020-01-20 DIAGNOSIS — H9312 Tinnitus, left ear: Secondary | ICD-10-CM | POA: Diagnosis not present

## 2021-06-23 ENCOUNTER — Other Ambulatory Visit (HOSPITAL_COMMUNITY): Payer: Self-pay | Admitting: Internal Medicine

## 2021-06-23 DIAGNOSIS — I517 Cardiomegaly: Secondary | ICD-10-CM

## 2021-06-29 ENCOUNTER — Other Ambulatory Visit: Payer: Self-pay

## 2021-06-29 ENCOUNTER — Ambulatory Visit (HOSPITAL_COMMUNITY): Payer: Medicare Other | Attending: Cardiology

## 2021-06-29 DIAGNOSIS — I517 Cardiomegaly: Secondary | ICD-10-CM | POA: Insufficient documentation

## 2021-06-29 LAB — ECHOCARDIOGRAM COMPLETE
Area-P 1/2: 2.86 cm2
S' Lateral: 3.6 cm

## 2021-08-08 NOTE — Progress Notes (Signed)
?Cardiology Office Note:   ? ?Date:  08/09/2021  ? ?ID:  Maurice Klein, DOB 03-10-1956, MRN [id number] ? ?PCP:  Lavone Orn, MD  ?Cardiologist:  None   ? ?Referring MD: Lavone Orn, MD  ? ? ? ?History of Present Illness:   ? ?Maurice Klein is a 66 y.o. male with a hx of iron deficiency anemia, LVH, GERD, and anxiety here today for the evaluation of an abnormal echocardiogram at the request of Dr Laurann Montana.  ? ?He last saw Dr. Laurann Montana 07/03/21 to discuss recent echo results. Echo revealed LVEF 65 to 70% and grade 1 diastolic dysfunction. R ventricle systolic pressure was [id number] mmHg and the ascending aorta was dilated to 42 mm. He was referred to cardiology. ? ?Today, he is doing well. He reports his blood pressure is labile. His blood pressure can go from 155/85 to 122/83 after a few minutes. Usually after 15 minutes, his blood pressure will stay in the 120s down to 0000000 systolic. He wonders if ARBs will help with blood pressure control. He believes beta-blockers would negatively affect him.  ? ?His mother had hypertension and had taken HCTZ for 20 years. She passed away at 60 years old. His father also had a history of blood pressure in the 0000000 systolic but was on no medications. He dies at 67 years old of hemorrhagic stroke. 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 He has never smoked and has an occasional glass of red wine.He takes an occasional Viagra.  ? ?He denies any palpitations, chest pain, or shortness of breath, lightheadedness, headaches, syncope, orthopnea, PND, lower extremity edema or exertional symptoms. ? ?Past Medical History:  ?Diagnosis Date  ? Anxiety   ? ED (erectile dysfunction)   ? GERD without esophagitis   ? Hyperthyroidism, subclinical   ? Iron deficiency anemia   ? LVH (left ventricular hypertrophy)   ? Tinnitus, left   ? ? ?History reviewed. No pertinent surgical history. ? ?Current Medications: ?Current Meds  ?Medication Sig   ? esomeprazole (NEXIUM) 20 MG capsule Take 20 mg by mouth daily as needed (acid reflux).  ? ferrous sulfate 325 (65 FE) MG EC tablet Take 325 mg by mouth 3 (three) times daily with meals.  ? irbesartan (AVAPRO) 75 MG tablet Take 1 tablet (75 mg total) by mouth daily.  ? LORazepam (ATIVAN) 1 MG tablet Take 1 mg by mouth every 8 (eight) hours.  ? Multiple Vitamin (MULTIVITAMIN WITH MINERALS) TABS tablet Take 1 tablet by mouth daily.  ? Multiple Vitamin (MULTIVITAMIN) tablet Take 1 tablet by mouth daily.  ? Omega-3 Fatty Acids (FISH OIL PO) Take 1 capsule by mouth daily.  ? sildenafil (REVATIO) 20 MG tablet Take 20 mg by mouth 3 (three) times daily.  ?  ? ?Allergies:   Patient has no known allergies.  ? ?Social History  ? ?Socioeconomic History  ? Marital status: Single  ?  Spouse name: Not on file  ? Number of children: Not on file  ? Years of education: Not on file  ? Highest education level: Not on file  ?Occupational History  ? Not on file  ?Tobacco Use  ? Smoking status: Never  ? Smokeless tobacco: Never  ?Vaping Use  ? Vaping Use: Never used  ?Substance and Sexual Activity  ? Alcohol use: Yes  ? Drug use: No  ? Sexual activity: Not on file  ?Other Topics Concern  ? Not on file  ?Social  History Narrative  ? Not on file  ? ?Social Determinants of Health  ? ?Financial Resource Strain: Not on file  ?Food Insecurity: Not on file  ?Transportation Needs: Not on file  ?Physical Activity: Not on file  ?Stress: Not on file  ?Social Connections: Not on file  ?  ? ?Family History: ?The patient's family history includes Cancer in his brother; Hypertension in his father and mother; Stroke (age of onset: 44) in his father. ? ?ROS:   ?Please see the history of present illness.    ?All other systems reviewed and negative.  ? ?EKGs/Labs/Other Studies Reviewed:   ? ?The following studies were reviewed today: ?Echo 06/29/21 ?1. Left ventricular ejection fraction, by estimation, is 65 to 70%. Left  ?ventricular ejection fraction by 3D  volume is 69 %. The left ventricle has  ?normal function. The left ventricle has no regional wall motion  ?abnormalities. The left ventricular  ?internal cavity size was mildly dilated. Left ventricular diastolic  ?parameters are consistent with Grade I diastolic dysfunction (impaired  ?relaxation). The average left ventricular global longitudinal strain is  ?25.9 %. The global longitudinal strain is  ?normal.  ? 2. Right ventricular systolic function is normal. The right ventricular  ?size is normal. There is mildly elevated pulmonary artery systolic  ?pressure. The estimated right ventricular systolic pressure is [id number] mmHg.  ? 3. The mitral valve is normal in structure. Trivial mitral valve  ?regurgitation. No evidence of mitral stenosis.  ? 4. The aortic valve is normal in structure. Aortic valve regurgitation is  ?not visualized. No aortic stenosis is present.  ? 5. Aortic dilatation noted. There is mild dilatation of the ascending  ?aorta, measuring 42 mm.  ? 6. The inferior vena cava is normal in size with greater than 50%  ?respiratory variability, suggesting right atrial pressure of 3 mmHg.  ? ?EKG:  EKG was personally reviewed ?08/09/21: Sinus rhythm, rate 84 bpm; PAC and LAD ? ?Recent Labs: ?No results found for requested labs within last 8760 hours.  ? ?Recent Lipid Panel ?No results found for: CHOL, TRIG, HDL, CHOLHDL, VLDL, LDLCALC, LDLDIRECT ? ?CHA2DS2-VASc Score =   [ ] .  Therefore, the patient's annual risk of stroke is   %.    ?  ? ? ?Physical Exam:   ? ?VS:  BP (!) 170/90   Pulse 84   Ht 5\' 10"  (1.778 m)   Wt 176 lb (79.8 kg)   SpO2 98%   BMI 25.25 kg/m?    ? ?Wt Readings from Last 3 Encounters:  ?08/09/21 176 lb (79.8 kg)  ?07/29/17 185 lb 6.4 oz (84.1 kg)  ?  ? ?GEN: Well nourished, well developed in no acute distress ?HEENT: Normal ?NECK: No JVD; No carotid bruits ?LYMPHATICS: No lymphadenopathy ?CARDIAC: RRR, no murmurs, rubs, gallops ?RESPIRATORY:  Clear to auscultation without rales,  wheezing or rhonchi  ?ABDOMEN: Soft, non-tender, non-distended ?MUSCULOSKELETAL:  No edema; No deformity  ?SKIN: Warm and dry ?NEUROLOGIC:  Alert and oriented x 3 ?PSYCHIATRIC:  Normal affect  ? ?ASSESSMENT:   ? ?1. Medication management   ?2. Aneurysm of ascending aorta without rupture   ?3. Elevated blood pressure reading   ? ?PLAN:   ? ?Ascending aortic aneurysm ?42 mm on echocardiogram 2023.  We will go ahead and place him on irbesartan 75 mg, angiotensin receptor blocker.  Blood pressure monitoring.  At home he usually checks it, after exercising for instance.  Overall looks good.  Obviously if hypotension occurs, he will  let us know.  We will check a basic metabolic profile in 1 month to ensure proper electrolytes.  Continue good diet and exercise.  Avoid Valsalva type maneuvers. ? ?Elevated blood pressure reading ?Starting irbesartan 75 mg once a day.  Continue to monitor. ? ?  ? ? ?Medication Adjustments/Labs and Tests Ordered: ?Current medicines are reviewed at length with the patient today.  Concerns regarding medicines are outlined above.  ?Orders Placed This Encounter  ?Procedures  ? Basic metabolic panel  ? EKG 12-Lead  ? ECHOCARDIOGRAM COMPLETE  ? ?Meds ordered this encounter  ?Medications  ? irbesartan (AVAPRO) 75 MG tablet  ?  Sig: Take 1 tablet (75 mg total) by mouth daily.  ?  Dispense:  90 tablet  ?  Refill:  3  ? ?I,Mykaella Javier,acting as a scribe for [employer], MD.,have documented all relevant documentation on the behalf of Candee Furbish, MD,as directed by  Candee Furbish, MD while in the presence of Candee Furbish, MD. ? ?I, Candee Furbish, MD, have reviewed all documentation for this visit. The documentation on 08/09/21 for the exam, diagnosis, procedures, and orders are all accurate and complete.  ? ?Signed, ?Candee Furbish, MD  ?08/09/2021 11:52 AM    ?[place] ? ?

## 2021-08-09 ENCOUNTER — Ambulatory Visit (INDEPENDENT_AMBULATORY_CARE_PROVIDER_SITE_OTHER): Payer: Medicare Other | Admitting: Cardiology

## 2021-08-09 ENCOUNTER — Encounter: Payer: Self-pay | Admitting: Cardiology

## 2021-08-09 ENCOUNTER — Other Ambulatory Visit: Payer: Self-pay

## 2021-08-09 VITALS — BP 170/90 | HR 84 | Ht 70.0 in | Wt 176.0 lb

## 2021-08-09 DIAGNOSIS — Z79899 Other long term (current) drug therapy: Secondary | ICD-10-CM | POA: Diagnosis not present

## 2021-08-09 DIAGNOSIS — I7121 Aneurysm of the ascending aorta, without rupture: Secondary | ICD-10-CM

## 2021-08-09 DIAGNOSIS — R030 Elevated blood-pressure reading, without diagnosis of hypertension: Secondary | ICD-10-CM

## 2021-08-09 MED ORDER — IRBESARTAN 75 MG PO TABS: 75.0000 mg | ORAL_TABLET | Freq: Every day | ORAL | 3 refills | Status: DC

## 2021-08-09 NOTE — Assessment & Plan Note (Addendum)
42 mm on echocardiogram 2023.  We will go ahead and place him on irbesartan 75 mg, angiotensin receptor blocker.  Blood pressure monitoring.  At home he usually checks it, after exercising for instance.  Overall looks good.  Obviously if hypotension occurs, he will let us know.  We will check a basic metabolic profile in 1 month to ensure proper electrolytes.  Continue good diet and exercise.  Avoid Valsalva type maneuvers. ?

## 2021-08-09 NOTE — Patient Instructions (Signed)
Medication Instructions:  ?Please start Irbesartan 75 mg a day. ?Continue all other medications as listed.  ? ?*If you need a refill on your cardiac medications before your next appointment, please call your pharmacy* ? ?Lab Work: ?Please have blood work in 1 month (BMP) ? ?If you have labs (blood work) drawn today and your tests are completely normal, you will receive your results only by: ?MyChart Message (if you have MyChart) OR ?A paper copy in the mail ?If you have any lab test that is abnormal or we need to change your treatment, we will call you to review the results. ? ?Testing/Procedures: ?Your physician has requested that you have an echocardiogram in 1 year. Echocardiography is a painless test that uses sound waves to create images of your heart. It provides your doctor with information about the size and shape of your heart and how well your heart?s chambers and valves are working. This procedure takes approximately one hour. There are no restrictions for this procedure. ? ?Follow-Up: ?At [facility], you and your health needs are our priority.  As part of our continuing mission to provide you with exceptional heart care, we have created designated Provider Care Teams.  These Care Teams include your primary Cardiologist (physician) and Advanced Practice Providers (APPs -  Physician Assistants and Nurse Practitioners) who all work together to provide you with the care you need, when you need it. ? ?We recommend signing up for the patient portal called "MyChart".  Sign up information is provided on this After Visit Summary.  MyChart is used to connect with patients for Virtual Visits (Telemedicine).  Patients are able to view lab/test results, encounter notes, upcoming appointments, etc.  Non-urgent messages can be sent to your provider as well.   ?To learn more about what you can do with MyChart, go to [email or web address].   ? ?Your next appointment:   ?1 year(s) ? ?The format for your next  appointment:   ?In Person ? ?Provider:   ?Dr Candee Furbish ? ?Thank you for choosing [facility]!! ? ? ? ?

## 2021-08-09 NOTE — Assessment & Plan Note (Signed)
Starting irbesartan 75 mg once a day.  Continue to monitor. ?

## 2021-09-12 ENCOUNTER — Other Ambulatory Visit: Payer: Medicare Other

## 2021-09-12 DIAGNOSIS — I7121 Aneurysm of the ascending aorta, without rupture: Secondary | ICD-10-CM

## 2021-09-12 DIAGNOSIS — Z79899 Other long term (current) drug therapy: Secondary | ICD-10-CM

## 2021-09-12 LAB — BASIC METABOLIC PANEL
BUN/Creatinine Ratio: 32 — ABNORMAL HIGH (ref 10–24)
BUN: 23 mg/dL (ref 8–27)
CO2: 23 mmol/L (ref 20–29)
Calcium: 9.6 mg/dL (ref 8.6–10.2)
Chloride: 99 mmol/L (ref 96–106)
Creatinine, Ser: 0.72 mg/dL — ABNORMAL LOW (ref 0.76–1.27)
Glucose: 102 mg/dL — ABNORMAL HIGH (ref 70–99)
Potassium: 4.6 mmol/L (ref 3.5–5.2)
Sodium: 133 mmol/L — ABNORMAL LOW (ref 134–144)
eGFR: 101 mL/min/{1.73_m2} (ref >59)

## 2022-02-01 ENCOUNTER — Telehealth: Payer: Self-pay | Admitting: Cardiology

## 2022-02-01 DIAGNOSIS — Z79899 Other long term (current) drug therapy: Secondary | ICD-10-CM

## 2022-02-01 DIAGNOSIS — R030 Elevated blood-pressure reading, without diagnosis of hypertension: Secondary | ICD-10-CM

## 2022-02-01 NOTE — Telephone Encounter (Signed)
Calling to see if needs to have additional blood work done. Please advise

## 2022-02-01 NOTE — Telephone Encounter (Signed)
Pt is calling to ask if he will need another BMET soon, for survelliance of his irbesartan.  Pts last BMET as on 4/19 (copied below) and was stable and advised to continue this regimen.  No mention of repeat labs based on this result.  None noted in OV note.   Pt will see Dr. Anne Fu as planned in March, with a follow-up echo.  He just wanted to make sure another BMET isn't needed in the interim.  Informed the pt that being his last BMET on 09/13/21 was stable, I doubt he will need another repeat, until he see's Dr. Anne Fu in March 2024.  Did inform him that I will still run this by Dr. Anne Fu though and have his RN call him back, only if a repeat BMET is indicated prior to his next visit.   Pt verbalized understanding and agrees with this plan.      Jake Bathe, MD  09/13/2021  2:43 PM EDT     Stable labs after starting irbesartan. Donato Schultz, MD

## 2022-02-02 ENCOUNTER — Other Ambulatory Visit: Payer: Self-pay | Admitting: *Deleted

## 2022-02-02 DIAGNOSIS — Z79899 Other long term (current) drug therapy: Secondary | ICD-10-CM

## 2022-02-02 DIAGNOSIS — R030 Elevated blood-pressure reading, without diagnosis of hypertension: Secondary | ICD-10-CM

## 2022-02-02 NOTE — Telephone Encounter (Signed)
Sure.  Maurice Klein go ahead and check a basic metabolic profile prior to his visit.  Donato Schultz, MD   Pt is aware and orders placed.  He will go to his closest [facility] to have drawn.  Pt lives in [place].

## 2022-02-07 LAB — BASIC METABOLIC PANEL
BUN/Creatinine Ratio: 28 — ABNORMAL HIGH (ref 10–24)
BUN: 22 mg/dL (ref 8–27)
CO2: 27 mmol/L (ref 20–29)
Calcium: 9.4 mg/dL (ref 8.6–10.2)
Chloride: 96 mmol/L (ref 96–106)
Creatinine, Ser: 0.78 mg/dL (ref 0.76–1.27)
Glucose: 95 mg/dL (ref 70–99)
Potassium: 4.2 mmol/L (ref 3.5–5.2)
Sodium: 136 mmol/L (ref 134–144)
eGFR: 98 mL/min/{1.73_m2} (ref >59)

## 2022-07-30 ENCOUNTER — Other Ambulatory Visit: Payer: Self-pay | Admitting: Cardiology

## 2022-07-30 NOTE — Telephone Encounter (Signed)
Pt's medication was sent to pt's pharmacy as requested. Confirmation received.  °

## 2022-08-09 ENCOUNTER — Ambulatory Visit (HOSPITAL_COMMUNITY): Payer: Medicare Other | Attending: Internal Medicine

## 2022-08-09 DIAGNOSIS — I5030 Unspecified diastolic (congestive) heart failure: Secondary | ICD-10-CM | POA: Diagnosis not present

## 2022-08-09 DIAGNOSIS — I7121 Aneurysm of the ascending aorta, without rupture: Secondary | ICD-10-CM | POA: Diagnosis present

## 2022-08-09 LAB — ECHOCARDIOGRAM COMPLETE
Area-P 1/2: 1.87 cm2
S' Lateral: 3.9 cm

## 2022-08-29 ENCOUNTER — Encounter: Payer: Self-pay | Admitting: Physician Assistant

## 2022-08-29 ENCOUNTER — Ambulatory Visit: Payer: Medicare Other | Attending: Physician Assistant | Admitting: Cardiology

## 2022-08-29 VITALS — BP 117/68 | HR 79 | Ht 70.0 in | Wt 184.8 lb

## 2022-08-29 DIAGNOSIS — I5189 Other ill-defined heart diseases: Secondary | ICD-10-CM

## 2022-08-29 DIAGNOSIS — I10 Essential (primary) hypertension: Secondary | ICD-10-CM

## 2022-08-29 DIAGNOSIS — I7121 Aneurysm of the ascending aorta, without rupture: Secondary | ICD-10-CM

## 2022-08-29 NOTE — Progress Notes (Signed)
Cardiology Office Note:    Date:  08/29/2022   ID:  Maurice Klein, DOB [birth year], MRN [id number]  PCP:  Lavone Orn, MD   [place] Providers Cardiologist:  None     Referring MD: Lavone Orn, MD   CC: follow up for HTN, aortic dilatation  History of Present Illness:    Maurice Klein is a 67 y.o. male with a hx of iron deficiency anemia, diastolic dysfunction, GERD, anxiety, ascending aorta dilation.  He established care with Dr. Marlou Porch on 08/09/21 for evaluation of an abnormal echocardiogram at the behest of his PCP. Echo showed at this time showed and EF of 65-70%, grade I DD, mildly elevated PA pressure, trivial MR, mild dilatation of ascending aorta @ 42 mm. His BP was elevated but he reported that his BP was labile. He was started on Irbesartan.   Repeat echocardiogram on 08/14/22, which showed EF of 55-60, mild LVH, grade I DD, LA severely dilated, mild MR, mild dilation of the ascending aorta 40 mm.   He presents today for follow-up of his hypertension and aortic dilatation.  He continues to do well from a cardiac perspective.  He stays busy by working at [employer] part-time on the weekends.  He also goes to the gym several times a week, and rides the stationary bike at his home.  He checks his blood pressure frequently, presented blood pressure log and it is well managed at home, mostly in the [id number] systolic over [id number].  Today it is slightly elevated at 146/62 however he states he has a longstanding history of whitecoat hypertension and his blood pressure is always elevated outside of the home.  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 He denies chest pain, palpitations, dyspnea, pnd, orthopnea, n, v, dizziness, syncope, edema, weight gain, or early satiety.   Past Medical History:  Diagnosis Date   Anxiety    ED (erectile dysfunction)    GERD without esophagitis    Hyperthyroidism, subclinical    Iron deficiency anemia    LVH (left ventricular hypertrophy)     Tinnitus, left     No past surgical history on file.  Current Medications: Current Meds  Medication Sig   esomeprazole (NEXIUM) 20 MG capsule Take 20 mg by mouth daily as needed (acid reflux).   ferrous sulfate 325 (65 FE) MG EC tablet Take 325 mg by mouth 3 (three) times daily with meals.   irbesartan (AVAPRO) 75 MG tablet Take 1 tablet by mouth once daily   LORazepam (ATIVAN) 1 MG tablet Take 1 mg by mouth as needed for anxiety or sleep.   Multiple Vitamin (MULTIVITAMIN WITH MINERALS) TABS tablet Take 1 tablet by mouth daily.   Multiple Vitamin (MULTIVITAMIN) tablet Take 1 tablet by mouth daily.   Omega-3 Fatty Acids (FISH OIL PO) Take 1 capsule by mouth daily.   sildenafil (REVATIO) 20 MG tablet Take 20 mg by mouth as needed (erectile dysfunction).     Allergies:   Patient has no known allergies.   Social History   Socioeconomic History   Marital status: Single    Spouse name: Not on file   Number of children: Not on file   Years of education: Not on file   Highest education level: Not on file  Occupational History   Not on file  Tobacco Use   Smoking status: Never   Smokeless tobacco: Never  Vaping Use   Vaping Use: Never used  Substance and Sexual  Activity   Alcohol use: Yes   Drug use: No   Sexual activity: Not on file  Other Topics Concern   Not on file  Social History Narrative   Not on file   Social Determinants of Health   Financial Resource Strain: Not on file  Food Insecurity: Not on file  Transportation Needs: Not on file  Physical Activity: Not on file  Stress: Not on file  Social Connections: Not on file     Family History: The patient's family history includes Cancer in his brother; Hypertension in his father and mother; Stroke (age of onset: 32) in his father.  ROS:   Please see the history of present illness.     All other systems reviewed and are negative.  EKGs/Labs/Other Studies Reviewed:    The following studies were reviewed  today:  Cardiac Studies & Procedures       ECHOCARDIOGRAM  ECHOCARDIOGRAM COMPLETE 08/09/2022  Narrative ECHOCARDIOGRAM REPORT    Patient Name:   Maurice Klein Date of Exam: 08/09/2022 Medical Rec #:  [id number]     Height:       70.0 in Accession #:    [id number]    Weight:       176.0 lb Date of Birth:  [birth year]     BSA:          1.977 m Patient Age:    50 years      BP:           170/90 mmHg Patient Gender: M             HR:           62 bpm. Exam Location:  [place]  Procedure: 2D Echo, Cardiac Doppler and Color Doppler  Indications:    I71.21 Dilated aortic root  History:        Patient has prior history of Echocardiogram examinations, most recent 06/29/2021. LVH; Dilated aortic root.  Sonographer:    Coralyn Helling RDCS Referring Phys: [place]   1. Left ventricular ejection fraction, by estimation, is 55 to 60%. The left ventricle has normal function. The left ventricle has no regional wall motion abnormalities. There is mild concentric left ventricular hypertrophy. Left ventricular diastolic parameters are consistent with Grade I diastolic dysfunction (impaired relaxation). 2. Right ventricular systolic function is normal. The right ventricular size is normal. There is normal pulmonary artery systolic pressure. 3. Left atrial size was severely dilated. 4. The mitral valve is normal in structure. Mild mitral valve regurgitation. No evidence of mitral stenosis. 5. The aortic valve is tricuspid. Aortic valve regurgitation is trivial. No aortic stenosis is present. 6. Aortic dilatation noted. There is mild dilatation of the aortic root, measuring 37 mm. There is mild dilatation of the ascending aorta, measuring 40 mm. 7. The inferior vena cava is normal in size with greater than 50% respiratory variability, suggesting right atrial pressure of 3 mmHg.  FINDINGS Left Ventricle: Left ventricular ejection fraction, by estimation, is 55 to 60%.  The left ventricle has normal function. The left ventricle has no regional wall motion abnormalities. The left ventricular internal cavity size was normal in size. There is mild concentric left ventricular hypertrophy. Left ventricular diastolic parameters are consistent with Grade I diastolic dysfunction (impaired relaxation). Normal left ventricular filling pressure.  Right Ventricle: The right ventricular size is normal. No increase in right ventricular wall thickness. Right ventricular systolic function is normal. There is normal pulmonary artery systolic pressure.  The tricuspid regurgitant velocity is 2.61 m/s, and with an assumed right atrial pressure of 3 mmHg, the estimated right ventricular systolic pressure is 123XX123 mmHg.  Left Atrium: Left atrial size was severely dilated.  Right Atrium: Right atrial size was normal in size.  Pericardium: There is no evidence of pericardial effusion.  Mitral Valve: The mitral valve is normal in structure. Mild mitral valve regurgitation. No evidence of mitral valve stenosis.  Tricuspid Valve: The tricuspid valve is normal in structure. Tricuspid valve regurgitation is mild . No evidence of tricuspid stenosis.  Aortic Valve: The aortic valve is tricuspid. Aortic valve regurgitation is trivial. No aortic stenosis is present.  Pulmonic Valve: The pulmonic valve was normal in structure. Pulmonic valve regurgitation is mild. No evidence of pulmonic stenosis.  Aorta: Aortic dilatation noted. There is mild dilatation of the aortic root, measuring 37 mm. There is mild dilatation of the ascending aorta, measuring 40 mm.  Venous: The inferior vena cava is normal in size with greater than 50% respiratory variability, suggesting right atrial pressure of 3 mmHg.  IAS/Shunts: No atrial level shunt detected by color flow Doppler.   LEFT VENTRICLE PLAX 2D LVIDd:         5.60 cm   Diastology LVIDs:         3.90 cm   LV e' medial:    6.74 cm/s LV PW:          1.30 cm   LV E/e' medial:  9.0 LV IVS:        1.30 cm   LV e' lateral:   13.10 cm/s LVOT diam:     2.10 cm   LV E/e' lateral: 4.6 LV SV:         63 LV SV Index:   32 LVOT Area:     3.46 cm   RIGHT VENTRICLE             IVC RV S prime:     16.30 cm/s  IVC diam: 1.60 cm TAPSE (M-mode): 2.9 cm RVSP:           30.2 mmHg  LEFT ATRIUM              Index        RIGHT ATRIUM           Index LA diam:        5.00 cm  2.53 cm/m   RA Pressure: 3.00 mmHg LA Vol (A2C):   101.0 ml 51.09 ml/m  RA Area:     18.90 cm LA Vol (A4C):   65.0 ml  32.88 ml/m  RA Volume:   55.70 ml  28.17 ml/m LA Biplane Vol: 82.2 ml  41.58 ml/m AORTIC VALVE LVOT Vmax:   83.00 cm/s LVOT Vmean:  57.300 cm/s LVOT VTI:    0.182 m  AORTA Ao Root diam: 3.70 cm Ao Asc diam:  4.00 cm  MITRAL VALVE               TRICUSPID VALVE MV Area (PHT): 1.87 cm    TR Peak grad:   27.2 mmHg MV Decel Time: 405 msec    TR Vmax:        261.00 cm/s MV E velocity: 60.80 cm/s  Estimated RAP:  3.00 mmHg MV A velocity: 72.80 cm/s  RVSP:           30.2 mmHg MV E/A ratio:  0.84 SHUNTS Systemic VTI:  0.18 m Systemic Diam: 2.10 cm  Skeet Latch MD  Electronically signed by Skeet Latch MD Signature Date/Time: 08/09/2022/7:01:42 PM    Final              EKG:  EKG is  ordered today.  The ekg ordered today demonstrates SR with incomplete RBBB, known LVH, consistent with prior EKG tracings.   Recent Labs: 02/06/2022: BUN 22; Creatinine, Ser 0.78; Potassium 4.2; Sodium 136  Recent Lipid Panel No results found for: "CHOL", "TRIG", "HDL", "CHOLHDL", "VLDL", "LDLCALC", "LDLDIRECT"   Risk Assessment/Calculations:                Physical Exam:    VS:  BP 117/68 Comment: BP reading at home this am  Pulse 79   Ht 5\' 10"  (1.778 m)   Wt 184 lb 12.8 oz (83.8 kg)   SpO2 97%   BMI 26.52 kg/m     Wt Readings from Last 3 Encounters:  08/29/22 184 lb 12.8 oz (83.8 kg)  08/09/21 176 lb (79.8 kg)  07/29/17 185 lb 6.4 oz  (84.1 kg)     GEN: Appears younger than stated age, Well nourished, well developed in no acute distress HEENT: Normal NECK: No JVD; No carotid bruits LYMPHATICS: No lymphadenopathy CARDIAC: RRR, no murmurs, rubs, gallops RESPIRATORY:  Clear to auscultation without rales, wheezing or rhonchi  ABDOMEN: Soft, non-tender, non-distended MUSCULOSKELETAL:  No edema; No deformity  SKIN: Warm and dry NEUROLOGIC:  Alert and oriented x 3 PSYCHIATRIC:  Normal affect   ASSESSMENT:    1. Essential hypertension   2. Aneurysm of ascending aorta without rupture   3. Diastolic dysfunction    PLAN:    In order of problems listed above:  Hypertension -blood pressure in office today is slightly elevated at 146/62, blood pressure reading at home this morning was well-controlled at 117/68.  Blood pressure log reviewed from home reveals blood pressure is well-managed.  Continue Avapro 75 mg daily.  Will repeat BMET today.  Dilatation of the ascending aorta -most recent echo in March or 2024 showed stable dilation at 40 mm.  Will repeat imaging in 1 year for continued stability.  Diastolic dysfunction -mild concentric LVH, grade 1 DD. No HF related symptoms. He exercise regularly and is very physical in his work.     Disposition - BMET today. Echo in 1 year, then see Dr. Marlou Porch in one year after echo.          Medication Adjustments/Labs and Tests Ordered: Current medicines are reviewed at length with the patient today.  Concerns regarding medicines are outlined above.  Orders Placed This Encounter  Procedures   Basic metabolic panel   EKG [id number]   ECHOCARDIOGRAM COMPLETE   No orders of the defined types were placed in this encounter.   Patient Instructions  Medication Instructions:  Your physician recommends that you continue on your current medications as directed. Please refer to the Current Medication list given to you today.  *If you need a refill on your cardiac medications before  your next appointment, please call your pharmacy*   Lab Work: BMET If you have labs (blood work) drawn today and your tests are completely normal, you will receive your results only by: [place] (if you have MyChart) OR A paper copy in the mail If you have any lab test that is abnormal or we need to change your treatment, we will call you to review the results.   Testing/Procedures: Your physician has requested that you have an echocardiogram IN 1 YEAR. Echocardiography is a painless test  that uses sound waves to create images of your heart. It provides your doctor with information about the size and shape of your heart and how well your heart's chambers and valves are working. This procedure takes approximately one hour. There are no restrictions for this procedure. Please do NOT wear cologne, perfume, aftershave, or lotions (deodorant is allowed). Please arrive 15 minutes prior to your appointment time.    Follow-Up: At [facility], you and your health needs are our priority.  As part of our continuing mission to provide you with exceptional heart care, we have created designated Provider Care Teams.  These Care Teams include your primary Cardiologist (physician) and Advanced Practice Providers (APPs -  Physician Assistants and Nurse Practitioners) who all work together to provide you with the care you need, when you need it.  We recommend signing up for the patient portal called "MyChart".  Sign up information is provided on this After Visit Summary.  MyChart is used to connect with patients for Virtual Visits (Telemedicine).  Patients are able to view lab/test results, encounter notes, upcoming appointments, etc.  Non-urgent messages can be sent to your provider as well.   To learn more about what you can do with MyChart, go to [email or web address].    Your next appointment:   1 year(s)  Provider:   Candee Furbish, MD   Other Instructions     Signed, Trudi Ida, NP  08/29/2022 3:36 PM    [place]

## 2022-08-29 NOTE — Patient Instructions (Addendum)
Medication Instructions:  Your physician recommends that you continue on your current medications as directed. Please refer to the Current Medication list given to you today.  *If you need a refill on your cardiac medications before your next appointment, please call your pharmacy*   Lab Work: BMET If you have labs (blood work) drawn today and your tests are completely normal, you will receive your results only by: [place] (if you have MyChart) OR A paper copy in the mail If you have any lab test that is abnormal or we need to change your treatment, we will call you to review the results.   Testing/Procedures: Your physician has requested that you have an echocardiogram IN 1 YEAR. Echocardiography is a painless test that uses sound waves to create images of your heart. It provides your doctor with information about the size and shape of your heart and how well your heart's chambers and valves are working. This procedure takes approximately one hour. There are no restrictions for this procedure. Please do NOT wear cologne, perfume, aftershave, or lotions (deodorant is allowed). Please arrive 15 minutes prior to your appointment time.    Follow-Up: At [facility], you and your health needs are our priority.  As part of our continuing mission to provide you with exceptional heart care, we have created designated Provider Care Teams.  These Care Teams include your primary Cardiologist (physician) and Advanced Practice Providers (APPs -  Physician Assistants and Nurse Practitioners) who all work together to provide you with the care you need, when you need it.  We recommend signing up for the patient portal called "MyChart".  Sign up information is provided on this After Visit Summary.  MyChart is used to connect with patients for Virtual Visits (Telemedicine).  Patients are able to view lab/test results, encounter notes, upcoming appointments, etc.  Non-urgent messages can be  sent to your provider as well.   To learn more about what you can do with MyChart, go to [email or web address].    Your next appointment:   1 year(s)  Provider:   Candee Furbish, MD   Other Instructions

## 2022-08-30 LAB — BASIC METABOLIC PANEL WITH GFR
BUN/Creatinine Ratio: 29 — ABNORMAL HIGH (ref 10–24)
BUN: 23 mg/dL (ref 8–27)
CO2: 21 mmol/L (ref 20–29)
Calcium: 9.9 mg/dL (ref 8.6–10.2)
Chloride: 98 mmol/L (ref 96–106)
Creatinine, Ser: 0.78 mg/dL (ref 0.76–1.27)
Glucose: 121 mg/dL — ABNORMAL HIGH (ref 70–99)
Potassium: 4.5 mmol/L (ref 3.5–5.2)
Sodium: 133 mmol/L — ABNORMAL LOW (ref 134–144)
eGFR: 98 mL/min/1.73

## 2022-10-25 ENCOUNTER — Other Ambulatory Visit: Payer: Self-pay | Admitting: Cardiology

## 2023-08-27 ENCOUNTER — Ambulatory Visit (HOSPITAL_COMMUNITY): Payer: Medicare Other | Attending: Cardiology

## 2023-08-27 DIAGNOSIS — I7121 Aneurysm of the ascending aorta, without rupture: Secondary | ICD-10-CM | POA: Diagnosis present

## 2023-08-27 LAB — ECHOCARDIOGRAM COMPLETE
Area-P 1/2: 2.2 cm2
S' Lateral: 3.89 cm

## 2023-09-02 ENCOUNTER — Encounter: Payer: Self-pay | Admitting: Cardiology

## 2023-09-02 ENCOUNTER — Ambulatory Visit: Payer: Medicare Other | Attending: Cardiology | Admitting: Cardiology

## 2023-09-02 VITALS — BP 168/92 | HR 66 | Ht 70.0 in | Wt 185.0 lb

## 2023-09-02 DIAGNOSIS — I10 Essential (primary) hypertension: Secondary | ICD-10-CM | POA: Insufficient documentation

## 2023-09-02 DIAGNOSIS — I5189 Other ill-defined heart diseases: Secondary | ICD-10-CM | POA: Diagnosis present

## 2023-09-02 DIAGNOSIS — I7121 Aneurysm of the ascending aorta, without rupture: Secondary | ICD-10-CM | POA: Insufficient documentation

## 2023-09-02 NOTE — Progress Notes (Signed)
 Cardiology Office Note:  .   Date:  09/02/2023  ID:  Maurice Klein, DOB 01-18-1956, MRN [id number] PCP: Kirby Funk, MD (Inactive)  [facility] Providers Cardiologist:  Donato Schultz, MD    History of Present Illness: .   Maurice Klein is a 68 y.o. male Discussed the use of AI scribe software for clinical note transcription with the patient, who gave verbal consent to proceed.  History of Present Illness Maurice Klein is a 68 year old male with ascending aortic dilation who presents for follow-up.  Previous echocardiograms have shown stability in the ascending aorta measurements, with sizes of 42 mm in 2023, 40 mm in 2024, and 41 mm on August 27, 2023. He has no symptoms of heart failure and maintains regular physical activity, including heavy lifting at work.  He monitors his blood pressure at home using an [employer] device and another monitor linked to his healthcare provider. Initial readings are in the 140s, which decrease to the 120s after resting. His average blood pressure is 135/79 mmHg. He is currently on a minimal dose of irbesartan.  He notes that his pulse can drop into the 40s when relaxed, which he attributes to his regular exercise routine. He inquires about the relationship between pulse rate and blood pressure, noting variability in his readings.  He has mild concentric left ventricular hypertrophy and grade one diastolic dysfunction. His ejection fraction is between 60 to 65%.      No CP  Studies Reviewed: Marland Kitchen   EKG Interpretation Date/Time:  Monday September 02 2023 13:48:02 EDT Ventricular Rate:  66 PR Interval:  170 QRS Duration:  100 QT Interval:  390 QTC Calculation: 408 R Axis:   -4  Text Interpretation: Sinus rhythm with Premature atrial complexes Moderate voltage criteria for LVH, may be normal variant ( R in aVL , Cornell product ) When compared with ECG of 20-Jun-2017 03:27, No significant change since last tracing Confirmed by Donato Schultz ([id number]) on  09/02/2023 1:50:41 PM    Results DIAGNOSTIC Echocardiogram: Ascending aorta 41 mm, stable. Mild concentric left ventricular hypertrophy (LVH). Grade 1 diastolic dysfunction. Ejection fraction 60-65%. (08/27/2023) EKG: Normal Risk Assessment/Calculations:           Physical Exam:   VS:  BP (!) 168/92   Pulse 66   Ht 5\' 10"  (1.778 m)   Wt 185 lb (83.9 kg)   SpO2 99%   BMI 26.54 kg/m    Wt Readings from Last 3 Encounters:  09/02/23 185 lb (83.9 kg)  08/29/22 184 lb 12.8 oz (83.8 kg)  08/09/21 176 lb (79.8 kg)    GEN: Well nourished, well developed in no acute distress NECK: No JVD; No carotid bruits CARDIAC: RRR, no murmurs, no rubs, no gallops RESPIRATORY:  Clear to auscultation without rales, wheezing or rhonchi  ABDOMEN: Soft, non-tender, non-distended EXTREMITIES:  No edema; No deformity   ASSESSMENT AND PLAN: .    Assessment and Plan Assessment & Plan Ascending aortic dilation The ascending aortic dilation is minimally dilated, measuring 41 mm in 2025, with previous measurements of 42 mm in 2023 and 40 mm in 2024. It is asymptomatic and not concerning. - Schedule follow-up echocardiogram in two years - Continue regular activities without restrictions  Hypertension (white coat) Blood pressure readings at home average 135/79 mmHg, with variability from the 120s to 140s. He is on a minimal dose of irbesartan, aiming to maintain systolic blood pressure in the 120s. Current management is satisfactory. - Continue current  dose of irbesartan - Discuss potential dose increase with primary care physician if needed  Mild concentric left ventricular hypertrophy and grade 1 diastolic dysfunction He has mild concentric LVH and grade 1 diastolic dysfunction with an ejection fraction of 60-65%, indicating preserved systolic function. There are no symptoms of heart failure. - Continue regular exercise and physical activity  Bradycardia He experiences bradycardia with a pulse rate in  the 40s when relaxed, likely due to regular exercise. This is common in physically active individuals and not concerning. - Report any symptoms of concern  Follow-up He is doing well overall, with well-managed conditions and no significant changes in health status. - Schedule follow-up appointment in two years          Signed, Donato Schultz, MD

## 2023-09-02 NOTE — Patient Instructions (Signed)
 Medication Instructions:  The current medical regimen is effective;  continue present plan and medications.  *If you need a refill on your cardiac medications before your next appointment, please call your pharmacy*   Follow-Up: At [facility], you and your health needs are our priority.  As part of our continuing mission to provide you with exceptional heart care, our providers are all part of one team.  This team includes your primary Cardiologist (physician) and Advanced Practice Providers or APPs (Physician Assistants and Nurse Practitioners) who all work together to provide you with the care you need, when you need it.  Your next appointment:   2 year(s)  Provider:   Donato Schultz, MD    We recommend signing up for the patient portal called "MyChart".  Sign up information is provided on this After Visit Summary.  MyChart is used to connect with patients for Virtual Visits (Telemedicine).  Patients are able to view lab/test results, encounter notes, upcoming appointments, etc.  Non-urgent messages can be sent to your provider as well.   To learn more about what you can do with MyChart, go to [email or web address].      1st Floor: - Lobby - Registration  - Pharmacy  - Lab - Cafe  2nd Floor: - PV Lab - Diagnostic Testing (echo, CT, nuclear med)  3rd Floor: - Vacant  4th Floor: - TCTS (cardiothoracic surgery) - AFib Clinic - Structural Heart Clinic - Vascular Surgery  - Vascular Ultrasound  5th Floor: - HeartCare Cardiology (general and EP) - Clinical Pharmacy for coumadin, hypertension, lipid, weight-loss medications, and med management appointments    Valet parking services will be available as well.

## 2024-04-01 ENCOUNTER — Ambulatory Visit: Attending: Internal Medicine | Admitting: Audiologist

## 2024-04-01 DIAGNOSIS — H9313 Tinnitus, bilateral: Secondary | ICD-10-CM | POA: Insufficient documentation

## 2024-04-01 DIAGNOSIS — H903 Sensorineural hearing loss, bilateral: Secondary | ICD-10-CM | POA: Diagnosis present

## 2024-04-01 NOTE — Procedures (Signed)
  Outpatient Audiology and [facility] [address] [place], [place]  [address] [phone]  AUDIOLOGICAL  EVALUATION  NAME: Maurice Klein     DOB:   [birth year]      MRN: [id number]                                                                                     DATE: 04/01/2024     REFERENT: Charlott Dorn LABOR, MD STATUS: Outpatient DIAGNOSIS: Tinnitus, Sensorineural hearing Loss    History: Victorious was seen for an audiological evaluation due to ringing in both ears. Kijana works on the [employer] and is required to use foam plugs. He notices the ringing in his ears is worse after taking off the earplugs. The ringing is worse when he lays down. He feels he hears well over all.  Sal denies pain or pressure in either ear. Tinnitus is in both ears. It sounds like a cicada with occasional high pitched tones.  Milen has history of hazardous noise exposure.  Medical history shows no other risk for hearing loss.    Evaluation:  Otoscopy showed a clear view of the tympanic membranes, bilaterally Tympanometry results were consistent with normal middle ear function, bilaterally   Audiometric testing was completed using Conventional Audiometry techniques with insert earphones and supraural headphones. Test results are consistent with normal hearing 250-2kHz sloping to mild sensorineural loss 3-8kHz bilaterally. Speech Recognition Thresholds were obtained at 20 dB HL in the right ear and at 20 dB HL in the left ear. Word Recognition Testing was completed at  40dB SL, 100% in each ear Tinnitus pitch matched to [facility] pure tone, later Tanush remarked it was more of a cicadae sound' than a pure tone   Results:  The test results were reviewed with Malic. He has a mid high pitched sensorineural  hearing loss. He is not yet a hearing aid candidate due to normal hearing at [facility] and normal speech reception. He does not notice the hearing loss regularly. Masking for tinnitus  recommended to help keep him from fixating. Use different earplugs at work, extended use of earplugs will exacerbate tinnitus. All questions answered.   Audiogram printed and provided to Beth.    Recommendations: Use of Ozlo Buds or the MyNoise App is recommended to mask tinnitus when needed. See packet for details. Use of masker should be at night and when in quiet environments where the tinnitus is loud or when around triggering sound. Masker should be played at lowest level possible that provides relief from tinnitus.  Audiometric testing recommended to monitor hearing loss for progression.   32 minutes spent testing and counseling on results.   If you have any questions please feel free to contact me at [phone].  Lauraine Ka Stalnaker Au.D.  Audiologist   04/01/2024  4:37 PM  Cc: Charlott Dorn LABOR, MD
